# Patient Record
Sex: Female | Born: 1950 | Race: Black or African American | Hispanic: No | Smoking: Never smoker
Health system: Southern US, Community
[De-identification: ages and names within clinical notes are randomized; demographics above are authoritative.]

## PROBLEM LIST (undated history)

## (undated) DIAGNOSIS — C50919 Malignant neoplasm of unspecified site of unspecified female breast: Secondary | ICD-10-CM

## (undated) DIAGNOSIS — Z923 Personal history of irradiation: Secondary | ICD-10-CM

## (undated) DIAGNOSIS — C801 Malignant (primary) neoplasm, unspecified: Secondary | ICD-10-CM

## (undated) HISTORY — PX: BREAST LUMPECTOMY: SHX2

## (undated) HISTORY — PX: BREAST BIOPSY: SHX20

## (undated) HISTORY — PX: BREAST EXCISIONAL BIOPSY: SUR124

---

## 1997-09-29 ENCOUNTER — Ambulatory Visit (HOSPITAL_COMMUNITY): Admission: RE | Admit: 1997-09-29 | Discharge: 1997-09-29 | Payer: Self-pay | Admitting: Surgery

## 1997-10-15 ENCOUNTER — Encounter: Admission: RE | Admit: 1997-10-15 | Discharge: 1998-01-13 | Payer: Self-pay | Admitting: Radiation Oncology

## 1997-11-06 ENCOUNTER — Ambulatory Visit (HOSPITAL_COMMUNITY): Admission: RE | Admit: 1997-11-06 | Discharge: 1997-11-06 | Payer: Self-pay | Admitting: Surgery

## 1998-01-14 ENCOUNTER — Encounter: Admission: RE | Admit: 1998-01-14 | Discharge: 1998-04-14 | Payer: Self-pay | Admitting: Radiation Oncology

## 1999-04-05 ENCOUNTER — Encounter: Admission: RE | Admit: 1999-04-05 | Discharge: 1999-04-05 | Payer: Self-pay | Admitting: Obstetrics and Gynecology

## 1999-04-05 ENCOUNTER — Encounter: Payer: Self-pay | Admitting: Obstetrics and Gynecology

## 1999-10-13 ENCOUNTER — Encounter: Payer: Self-pay | Admitting: Surgery

## 1999-10-13 ENCOUNTER — Encounter: Admission: RE | Admit: 1999-10-13 | Discharge: 1999-10-13 | Payer: Self-pay | Admitting: Surgery

## 2000-10-17 ENCOUNTER — Encounter: Admission: RE | Admit: 2000-10-17 | Discharge: 2000-10-17 | Payer: Self-pay | Admitting: Surgery

## 2000-10-17 ENCOUNTER — Encounter: Payer: Self-pay | Admitting: Surgery

## 2001-10-19 ENCOUNTER — Encounter: Payer: Self-pay | Admitting: Surgery

## 2001-10-19 ENCOUNTER — Encounter: Admission: RE | Admit: 2001-10-19 | Discharge: 2001-10-19 | Payer: Self-pay | Admitting: Surgery

## 2002-11-13 ENCOUNTER — Encounter: Payer: Self-pay | Admitting: Surgery

## 2002-11-13 ENCOUNTER — Encounter: Admission: RE | Admit: 2002-11-13 | Discharge: 2002-11-13 | Payer: Self-pay | Admitting: Surgery

## 2003-07-31 ENCOUNTER — Emergency Department (HOSPITAL_COMMUNITY): Admission: EM | Admit: 2003-07-31 | Discharge: 2003-07-31 | Payer: Self-pay | Admitting: Family Medicine

## 2003-12-17 ENCOUNTER — Encounter: Admission: RE | Admit: 2003-12-17 | Discharge: 2003-12-17 | Payer: Self-pay | Admitting: Surgery

## 2004-12-29 ENCOUNTER — Encounter: Admission: RE | Admit: 2004-12-29 | Discharge: 2004-12-29 | Payer: Self-pay | Admitting: Obstetrics and Gynecology

## 2006-01-03 ENCOUNTER — Encounter: Admission: RE | Admit: 2006-01-03 | Discharge: 2006-01-03 | Payer: Self-pay | Admitting: Obstetrics and Gynecology

## 2007-01-09 ENCOUNTER — Encounter: Admission: RE | Admit: 2007-01-09 | Discharge: 2007-01-09 | Payer: Self-pay | Admitting: Obstetrics and Gynecology

## 2009-05-05 ENCOUNTER — Encounter: Admission: RE | Admit: 2009-05-05 | Discharge: 2009-05-05 | Payer: Self-pay | Admitting: Obstetrics and Gynecology

## 2009-05-12 ENCOUNTER — Encounter: Admission: RE | Admit: 2009-05-12 | Discharge: 2009-05-12 | Payer: Self-pay | Admitting: Obstetrics and Gynecology

## 2009-12-14 ENCOUNTER — Emergency Department (HOSPITAL_COMMUNITY): Admission: EM | Admit: 2009-12-14 | Discharge: 2009-12-14 | Payer: Self-pay | Admitting: Family Medicine

## 2010-05-06 ENCOUNTER — Encounter
Admission: RE | Admit: 2010-05-06 | Discharge: 2010-05-06 | Payer: Self-pay | Source: Home / Self Care | Attending: Obstetrics and Gynecology | Admitting: Obstetrics and Gynecology

## 2010-10-02 IMAGING — US US BREAST R
1 series · 9 of 9 positions shown · non-contrast
Comparison: Prior studies

CLINICAL DATA: Abnormal screening mammogram.

DIGITAL DIAGNOSTIC RIGHT BREAST MAMMOGRAM  AND RIGHT BREAST
ULTRASOUND:

[Series 1: us breast right · 9 of 9 slices shown]
[im 1/9]
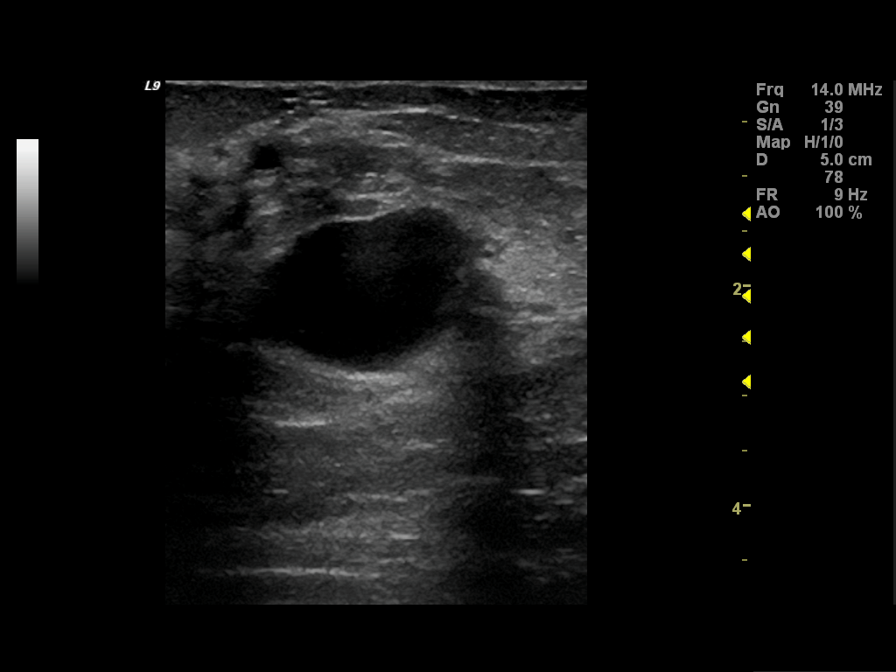
[im 2/9]
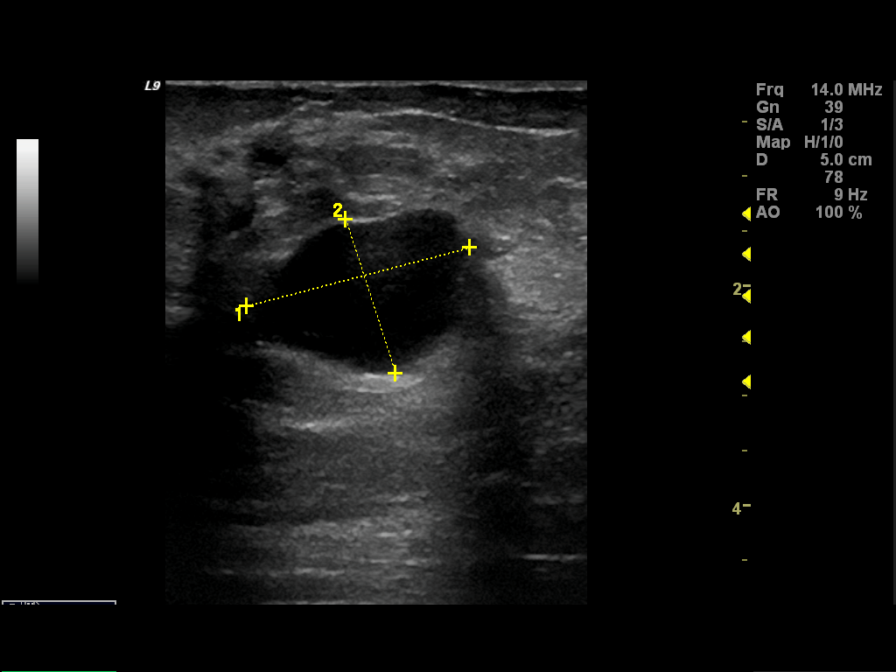
[im 3/9]
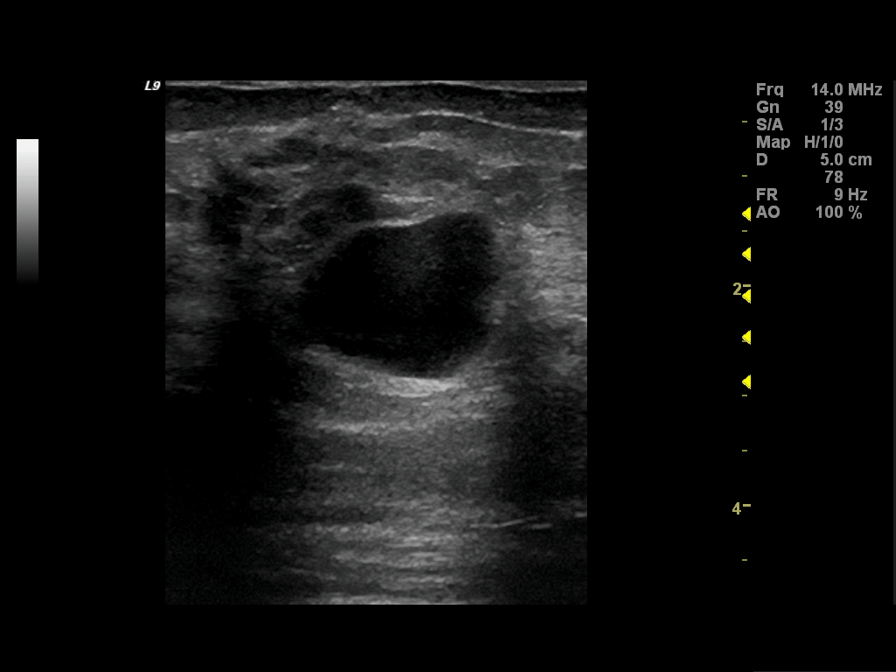
[im 4/9]
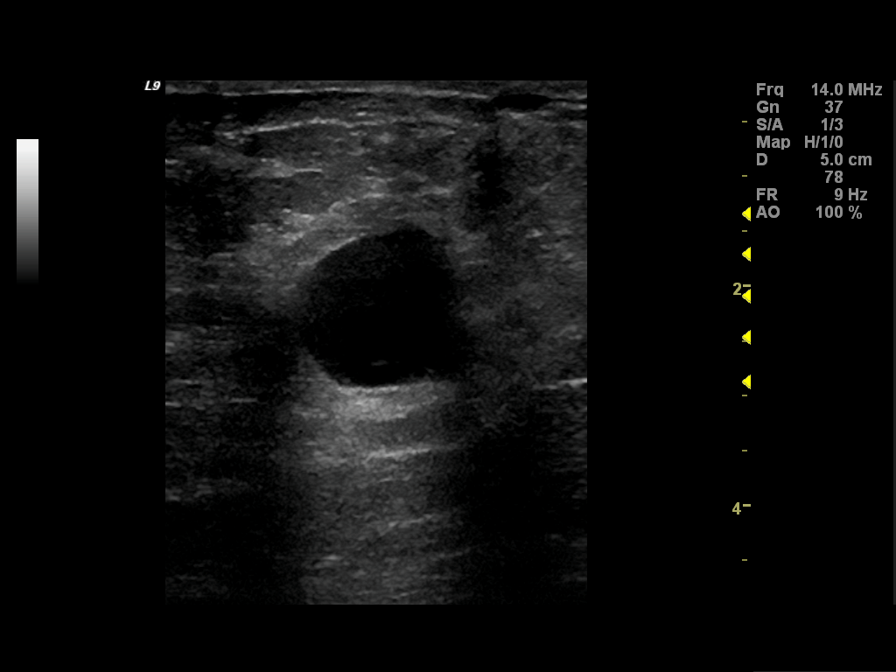
[im 5/9]
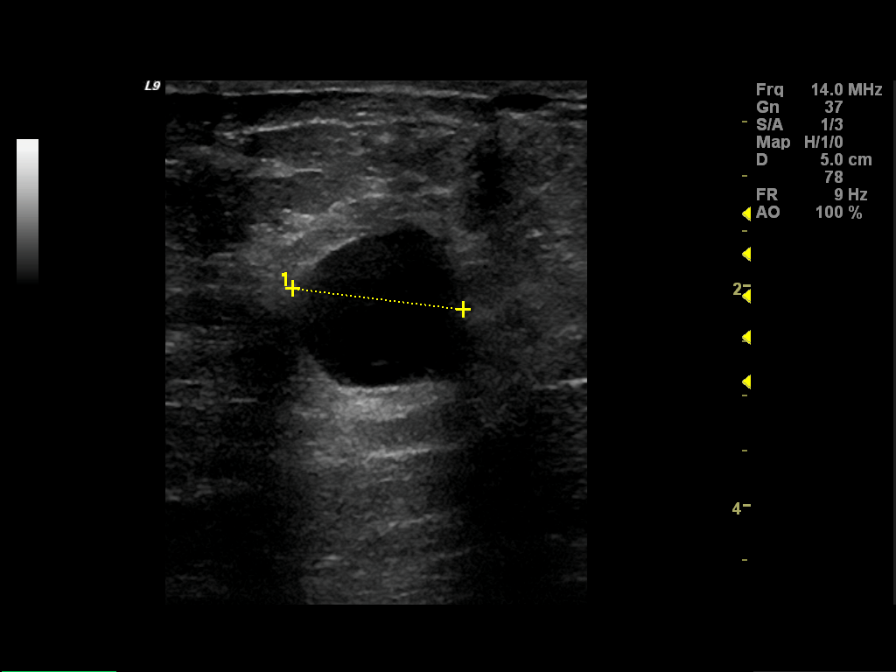
[im 6/9]
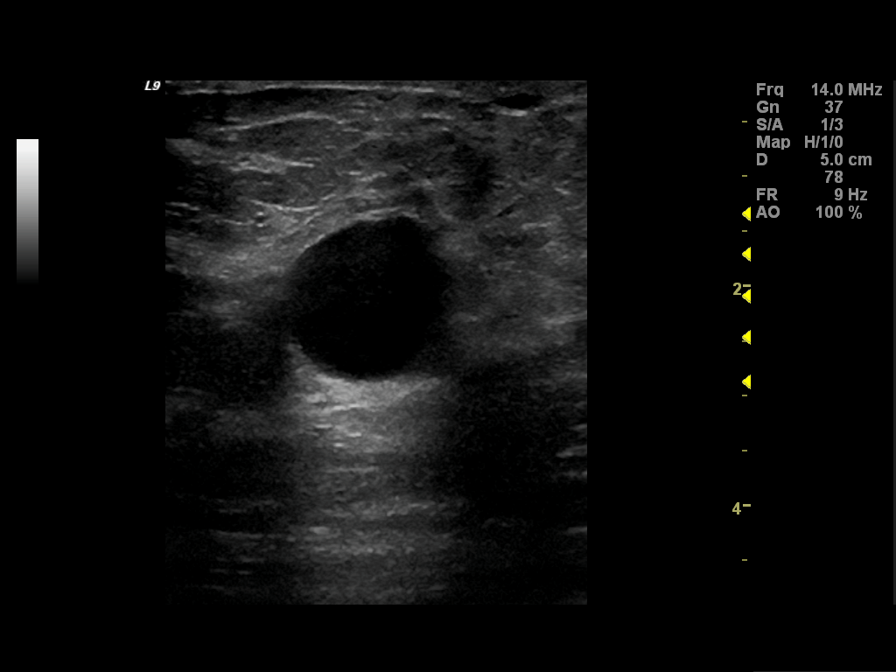
[im 7/9]
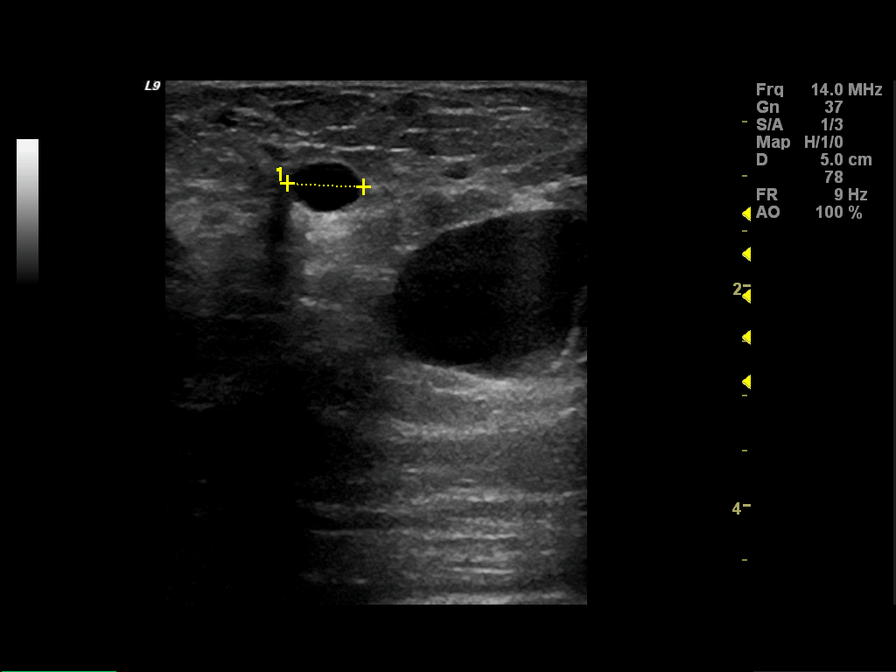
[im 8/9]
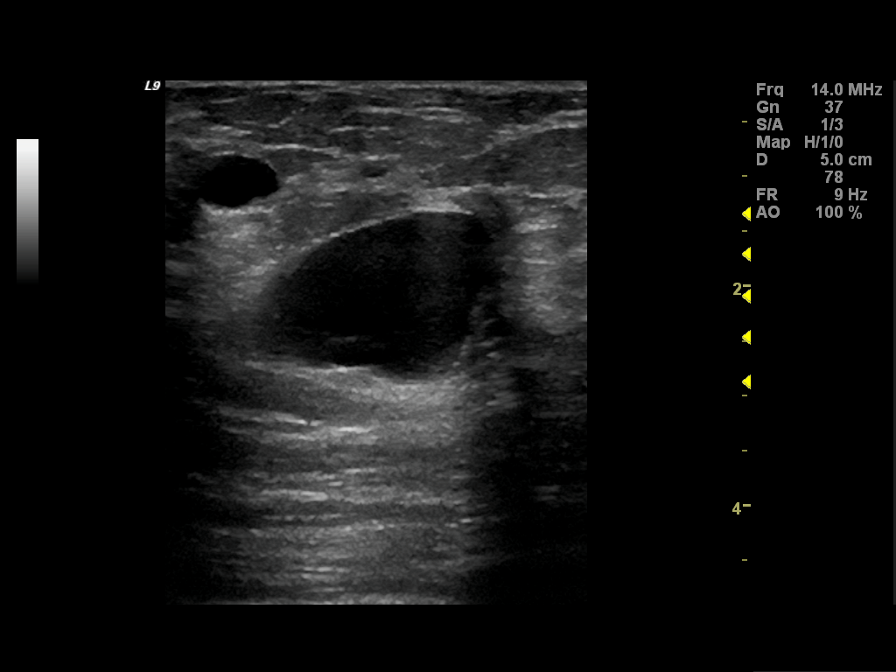
[im 9/9]
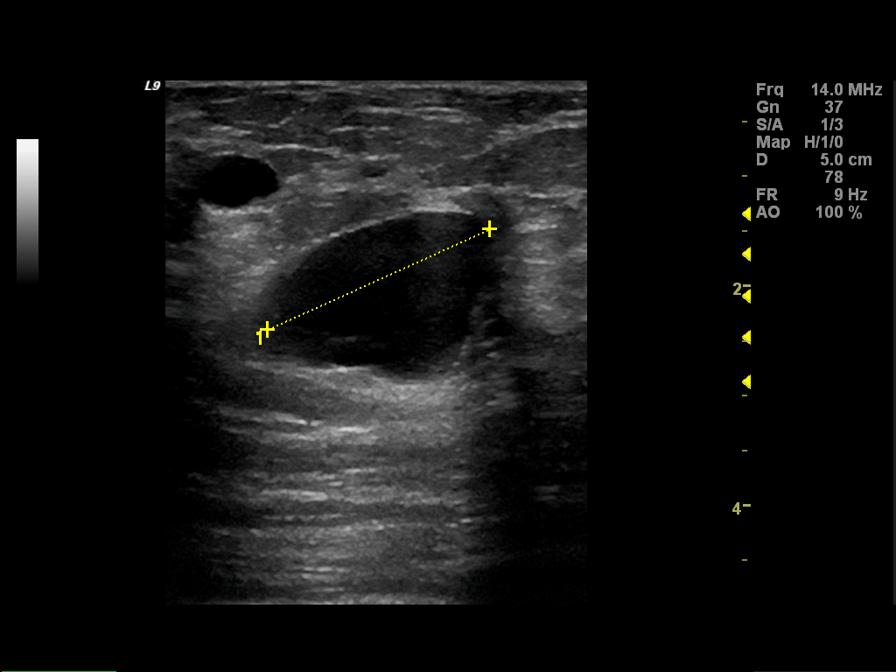

[9 of 9 positions shown; findings below may reference images not displayed]

FINDINGS: Spot compression views of the right breast demonstrate
an oval circumscribed nodule to be present within the right breast
at the 12 o'clock position approximately 2 cm from the nipple.

On physical exam, there is no discrete palpable abnormality.

Ultrasound is performed, showing a simple cyst located within the
right breast at the 12 o'clock position 2 cm from the nipple.  This
corresponds to the nodule seen in this region on mammography.  This
measures 2.2 cm in greatest dimension.  There is an adjacent 7 mm
in size simple cyst also present.
IMPRESSION: The nodule noted on the recent screening mammogram within the right
breast corresponds to a 2.2 cm in size simple cyst.  There is a
smaller 7 mm in size adjacent simple cyst present, as well.  No
findings worrisome for malignancy.  Recommend screening mammography
in 1 year.

BI-RADS CATEGORY 2:  Benign finding(s).

## 2011-03-14 ENCOUNTER — Other Ambulatory Visit: Payer: Self-pay | Admitting: Obstetrics and Gynecology

## 2011-03-14 DIAGNOSIS — Z1231 Encounter for screening mammogram for malignant neoplasm of breast: Secondary | ICD-10-CM

## 2011-05-09 ENCOUNTER — Ambulatory Visit
Admission: RE | Admit: 2011-05-09 | Discharge: 2011-05-09 | Disposition: A | Discharge status: Home / Self Care | Payer: 59 | Source: Ambulatory Visit | Attending: Obstetrics and Gynecology | Admitting: Obstetrics and Gynecology

## 2011-05-09 DIAGNOSIS — Z1231 Encounter for screening mammogram for malignant neoplasm of breast: Secondary | ICD-10-CM

## 2012-05-14 ENCOUNTER — Other Ambulatory Visit: Payer: Self-pay | Admitting: Obstetrics and Gynecology

## 2012-05-14 DIAGNOSIS — Z1231 Encounter for screening mammogram for malignant neoplasm of breast: Secondary | ICD-10-CM

## 2012-06-13 ENCOUNTER — Ambulatory Visit: Payer: 59

## 2016-09-13 ENCOUNTER — Other Ambulatory Visit: Payer: Self-pay | Admitting: Family Medicine

## 2016-09-13 DIAGNOSIS — Z1231 Encounter for screening mammogram for malignant neoplasm of breast: Secondary | ICD-10-CM

## 2016-10-05 ENCOUNTER — Ambulatory Visit
Admission: RE | Admit: 2016-10-05 | Discharge: 2016-10-05 | Disposition: A | Discharge status: Home / Self Care | Payer: Medicare Other | Source: Ambulatory Visit | Attending: Family Medicine | Admitting: Family Medicine

## 2016-10-05 DIAGNOSIS — Z1231 Encounter for screening mammogram for malignant neoplasm of breast: Secondary | ICD-10-CM

## 2016-10-05 HISTORY — DX: Malignant (primary) neoplasm, unspecified: C80.1

## 2016-10-05 HISTORY — DX: Malignant neoplasm of unspecified site of unspecified female breast: C50.919

## 2016-10-05 HISTORY — DX: Personal history of irradiation: Z92.3

## 2017-11-16 ENCOUNTER — Other Ambulatory Visit: Payer: Self-pay | Admitting: Family Medicine

## 2017-11-16 DIAGNOSIS — Z1231 Encounter for screening mammogram for malignant neoplasm of breast: Secondary | ICD-10-CM

## 2017-11-29 ENCOUNTER — Ambulatory Visit
Admission: RE | Admit: 2017-11-29 | Discharge: 2017-11-29 | Disposition: A | Discharge status: Home / Self Care | Payer: Medicare Other | Source: Ambulatory Visit | Attending: Family Medicine | Admitting: Family Medicine

## 2017-11-29 DIAGNOSIS — Z1231 Encounter for screening mammogram for malignant neoplasm of breast: Secondary | ICD-10-CM

## 2019-01-30 ENCOUNTER — Ambulatory Visit (INDEPENDENT_AMBULATORY_CARE_PROVIDER_SITE_OTHER): Payer: Medicare Other

## 2019-01-30 ENCOUNTER — Ambulatory Visit (HOSPITAL_COMMUNITY)
Admission: EM | Admit: 2019-01-30 | Discharge: 2019-01-30 | Disposition: A | Discharge status: Home / Self Care | Payer: Medicare Other | Attending: Emergency Medicine | Admitting: Emergency Medicine

## 2019-01-30 ENCOUNTER — Encounter (HOSPITAL_COMMUNITY): Payer: Self-pay

## 2019-01-30 ENCOUNTER — Other Ambulatory Visit: Payer: Self-pay

## 2019-01-30 DIAGNOSIS — W010XXA Fall on same level from slipping, tripping and stumbling without subsequent striking against object, initial encounter: Secondary | ICD-10-CM | POA: Diagnosis not present

## 2019-01-30 DIAGNOSIS — S62619A Displaced fracture of proximal phalanx of unspecified finger, initial encounter for closed fracture: Secondary | ICD-10-CM

## 2019-01-30 MED ORDER — HYDROCODONE-ACETAMINOPHEN 5-325 MG PO TABS: 1.0000 | ORAL_TABLET | Freq: Four times a day (QID) | ORAL | 0 refills | Status: AC | PRN

## 2019-01-30 NOTE — Discharge Instructions (Signed)
Please follow up with Dr. Caralyn Guile at [facility]  Use anti-inflammatories for pain/swelling. You may take up to 600 mg Ibuprofen every 8 hours with food. You may supplement Ibuprofen with Tylenol [phone] mg every 8 hours.   Hydrocodone for severe pain, do not drive/work after taking

## 2019-01-30 NOTE — Progress Notes (Signed)
Orthopedic Tech Progress Note Patient Details:  Lori Burns 10/05/1950 [id number]  Ortho Devices Type of Ortho Device: Ulna gutter splint Ortho Device/Splint Location: ULE Ortho Device/Splint Interventions: Adjustment, Application, Ordered   Post Interventions Patient Tolerated: Well Instructions Provided: Care of device, Adjustment of device   Janit Pagan 01/30/2019, 11:17 AM

## 2019-01-30 NOTE — ED Notes (Signed)
Ortho tech paged  

## 2019-01-30 NOTE — ED Provider Notes (Signed)
[facility]    CSN: [id number] Arrival date & time: 01/30/19  0908      History   Chief Complaint Chief Complaint  Patient presents with   Fall   Hand Pain    HPI Lori Burns is a 68 y.o. female history of breast cancer, presenting today for evaluation of left hand/finger injury.  Yesterday she was walking out of her house, had her hand on the storm door tripped and fell while her finger was caught on the door.  Afterward she states that her left ring finger was in an "L" shape, she rushed inside grabbed her mascara and taped her finger to this in order to straighten it back out.  She has had pain swelling and bruising develop in her left ring finger as well as hand since.  Denies previous history of similar previous injury to this hand.  Denies numbness or tingling.  She has been icing and using Tylenol.  HPI  Past Medical History:  Diagnosis Date   Breast cancer ([place])    Cancer ([place])    Personal history of radiation therapy     There are no active problems to display for this patient.   Past Surgical History:  Procedure Laterality Date   BREAST BIOPSY     BREAST EXCISIONAL BIOPSY     BREAST LUMPECTOMY     left breast 1999    OB History   No obstetric history on file.      Home Medications    Prior to Admission medications   Medication Sig Start Date End Date Taking? Authorizing Provider  HYDROcodone-acetaminophen (NORCO/VICODIN) 5-325 MG tablet Take 1-2 tablets by mouth every 6 (six) hours as needed. 01/30/19   Leemon Ayala, Elesa Hacker, PA-C    Family History History reviewed. No pertinent family history.  Social History Social History   Tobacco Use   Smoking status: Never Smoker   Smokeless tobacco: Never Used  Substance Use Topics   Alcohol use: Never    Frequency: Never   Drug use: Never     Allergies   Patient has no known allergies.   Review of Systems Review of Systems  Constitutional: Negative for fatigue and  fever.  Eyes: Negative for visual disturbance.  Respiratory: Negative for shortness of breath.   Cardiovascular: Negative for chest pain.  Gastrointestinal: Negative for abdominal pain, nausea and vomiting.  Musculoskeletal: Positive for arthralgias and joint swelling.  Skin: Positive for color change. Negative for rash and wound.  Neurological: Negative for dizziness, weakness, light-headedness and headaches.     Physical Exam Triage Vital Signs ED Triage Vitals  Enc Vitals Group     BP 01/30/19 0927 (!) 187/97     Pulse Rate 01/30/19 0927 77     Resp 01/30/19 0927 16     Temp 01/30/19 0927 98.1 F (36.7 C)     Temp Source 01/30/19 0927 Temporal     SpO2 01/30/19 0927 98 %     Weight 01/30/19 0932 216 lb (98 kg)     Height --      Head Circumference --      Peak Flow --      Pain Score 01/30/19 0932 7     Pain Loc --      Pain Edu? --      Excl. in [place]? --    No data found.  Updated Vital Signs BP (!) 187/97 (BP Location: Left Arm)    Pulse 77    Temp  98.1 F (36.7 C) (Temporal)    Resp 16    Wt 216 lb (98 kg)    SpO2 98%   Visual Acuity Right Eye Distance:   Left Eye Distance:   Bilateral Distance:    Right Eye Near:   Left Eye Near:    Bilateral Near:     Physical Exam   UC Treatments / Results  Labs (all labs ordered are listed, but only abnormal results are displayed) Labs Reviewed - No data to display  EKG   Radiology Dg Hand Complete Left  Result Date: 01/30/2019 CLINICAL DATA:  Ring finger pain EXAM: LEFT HAND - COMPLETE 3+ VIEW COMPARISON:  None. FINDINGS: There is acute displaced comminuted fracture through the proximal phalanx of the fourth digit. There is surrounding soft tissue swelling. There are advanced degenerative changes of the first carpometacarpal joint. IMPRESSION: Acute displaced comminuted fracture of the proximal phalanx of the fourth digit. Electronically Signed   By: Constance Holster M.D.   On: 01/30/2019 10:12     Procedures Procedures (including critical care time)  Medications Ordered in UC Medications - No data to display  Initial Impression / Assessment and Plan / UC Course  I have reviewed the triage vital signs and the nursing notes.  Pertinent labs & imaging results that were available during my care of the patient were reviewed by me and considered in my medical decision making (see chart for details).      Acute comminuted displaced fracture of proximal left phalanx of left ring finger.  Placed in ulnar gutter and have follow-up with hand in the next 1 to 2 days.  Discussed with Orion Crook.  Provided hydrocodone for severe pain, recommended Tylenol for mild to moderate pain.  Do not drive or work after taking hydrocodone.Discussed strict return precautions. Patient verbalized understanding and is agreeable with plan.  Final Clinical Impressions(s) / UC Diagnoses   Final diagnoses:  Displaced fracture of proximal phalanx of finger of left hand     Discharge Instructions     Please follow up with Dr. Caralyn Guile at [facility]  Use anti-inflammatories for pain/swelling. You may take up to 600 mg Ibuprofen every 8 hours with food. You may supplement Ibuprofen with Tylenol [phone] every 8 hours.   Hydrocodone for severe pain, do not drive/work after taking     ED Prescriptions    Medication Sig Dispense Auth. Provider   HYDROcodone-acetaminophen (NORCO/VICODIN) 5-325 MG tablet Take 1-2 tablets by mouth every 6 (six) hours as needed. 10 tablet Leon Goodnow, [place] C, PA-C     I have reviewed the PDMP during this encounter.   Janith Lima, [place] 01/30/19 1113

## 2019-01-30 NOTE — ED Triage Notes (Signed)
Pt states she fell over something and hurt her left hand ( ring finger ). Pt states this happened yesterday.

## 2019-07-08 ENCOUNTER — Ambulatory Visit: Payer: Medicare Other | Attending: Internal Medicine

## 2019-07-08 DIAGNOSIS — Z23 Encounter for immunization: Secondary | ICD-10-CM | POA: Insufficient documentation

## 2019-07-08 NOTE — Progress Notes (Signed)
   Covid-19 Vaccination Clinic  Name:  Lori Burns    MRN: [id number] DOB: Feb 04, 1951  07/08/2019  Lori Burns was observed post Covid-19 immunization for 15 minutes without incidence. She was provided with Vaccine Information Sheet and instruction to access the V-Safe system.   Lori Burns was instructed to call 911 with any severe reactions post vaccine: Marland Kitchen Difficulty breathing  . Swelling of your face and throat  . A fast heartbeat  . A bad rash all over your body  . Dizziness and weakness    Immunizations Administered    Name Date Dose VIS Date Route   Pfizer COVID-19 Vaccine 07/08/2019 11:15 AM 0.3 mL 04/19/2019 Intramuscular   Manufacturer: [place]   Lot: [id number]   [place]: [id number]

## 2019-08-06 ENCOUNTER — Ambulatory Visit: Payer: Medicare Other | Attending: Internal Medicine

## 2019-08-06 DIAGNOSIS — Z23 Encounter for immunization: Secondary | ICD-10-CM

## 2019-08-06 NOTE — Progress Notes (Signed)
   Covid-19 Vaccination Clinic  Name:  Lori Burns    MRN: [id number] DOB: March 25, 1951  08/06/2019  [name] was observed post Covid-19 immunization for 15 minutes without incident. She was provided with Vaccine Information Sheet and instruction to access the V-Safe system.   [name] was instructed to call 911 with any severe reactions post vaccine: Marland Kitchen Difficulty breathing  . Swelling of face and throat  . A fast heartbeat  . A bad rash all over body  . Dizziness and weakness   Immunizations Administered    Name Date Dose VIS Date Route   Pfizer COVID-19 Vaccine 08/06/2019 11:51 AM 0.3 mL 04/19/2019 Intramuscular   Manufacturer: [place]   Lot: [id number]   [place]: [id number]

## 2020-01-08 ENCOUNTER — Other Ambulatory Visit: Payer: Self-pay | Admitting: Family Medicine

## 2020-01-08 DIAGNOSIS — Z1231 Encounter for screening mammogram for malignant neoplasm of breast: Secondary | ICD-10-CM

## 2020-01-31 ENCOUNTER — Ambulatory Visit
Admission: RE | Admit: 2020-01-31 | Discharge: 2020-01-31 | Disposition: A | Discharge status: Home / Self Care | Payer: Medicare Other | Source: Ambulatory Visit | Attending: Family Medicine | Admitting: Family Medicine

## 2020-01-31 ENCOUNTER — Other Ambulatory Visit: Payer: Self-pay

## 2020-01-31 DIAGNOSIS — Z1231 Encounter for screening mammogram for malignant neoplasm of breast: Secondary | ICD-10-CM

## 2020-02-04 ENCOUNTER — Other Ambulatory Visit: Payer: Self-pay | Admitting: Family Medicine

## 2020-02-04 DIAGNOSIS — R928 Other abnormal and inconclusive findings on diagnostic imaging of breast: Secondary | ICD-10-CM

## 2020-02-14 ENCOUNTER — Ambulatory Visit
Admission: RE | Admit: 2020-02-14 | Discharge: 2020-02-14 | Disposition: A | Discharge status: Home / Self Care | Payer: Medicare Other | Source: Ambulatory Visit | Attending: Family Medicine | Admitting: Family Medicine

## 2020-02-14 ENCOUNTER — Other Ambulatory Visit: Payer: Self-pay

## 2020-02-14 DIAGNOSIS — R928 Other abnormal and inconclusive findings on diagnostic imaging of breast: Secondary | ICD-10-CM

## 2020-07-09 DIAGNOSIS — I10 Essential (primary) hypertension: Secondary | ICD-10-CM | POA: Diagnosis not present

## 2020-07-09 DIAGNOSIS — H6502 Acute serous otitis media, left ear: Secondary | ICD-10-CM | POA: Diagnosis not present

## 2020-07-09 DIAGNOSIS — Z0000 Encounter for general adult medical examination without abnormal findings: Secondary | ICD-10-CM | POA: Diagnosis not present

## 2020-07-09 DIAGNOSIS — M8588 Other specified disorders of bone density and structure, other site: Secondary | ICD-10-CM | POA: Diagnosis not present

## 2020-07-09 DIAGNOSIS — E785 Hyperlipidemia, unspecified: Secondary | ICD-10-CM | POA: Diagnosis not present

## 2020-07-09 DIAGNOSIS — M170 Bilateral primary osteoarthritis of knee: Secondary | ICD-10-CM | POA: Diagnosis not present

## 2020-07-13 ENCOUNTER — Other Ambulatory Visit: Payer: Self-pay | Admitting: Family Medicine

## 2020-07-13 DIAGNOSIS — M8580 Other specified disorders of bone density and structure, unspecified site: Secondary | ICD-10-CM

## 2020-10-07 ENCOUNTER — Telehealth: Payer: Self-pay

## 2020-10-07 NOTE — Telephone Encounter (Signed)
I connected by phone with Lori Burns and/or patient's caregiver on 10/07/2020 at 1:14 PM to discuss the potential vaccination through our Homebound vaccination initiative.   Prevaccination Checklist for COVID-19 Vaccines  1.  Are you feeling sick today? no  2.  Have you ever received a dose of a COVID-19 vaccine?  yes      If yes, which one? Pfizer   How many dose of Covid-19 vaccine have your received and dates ? 3, 07/08/2019, 08/06/2019, 04/11/2020   Check all that apply: I live in a long-term care setting. no  I have been diagnosed with a medical condition(s). Please list: Caregiver for a homebound patient (pertinent to homebound status)  I am a first responder. no  I work in a long-term care facility, correctional facility, hospital, restaurant, retail setting, school, or other setting with high exposure to the public. no  4. Do you have a health condition or are you undergoing treatment that makes you moderately or severely immunocompromised? (This would include treatment for cancer or HIV, receipt of organ transplant, immunosuppressive therapy or high-dose corticosteroids, CAR-T-cell therapy, hematopoietic cell transplant [HCT], DiGeorge syndrome or Wiskott-Aldrich syndrome)  no  5. Have you received hematopoietic cell transplant (HCT) or CAR-T-cell therapies since receiving COVID-19 vaccine? no  6.  Have you ever had an allergic reaction: (This would include a severe reaction [ e.g., anaphylaxis] that required treatment with epinephrine or EpiPen or that caused you to go to the hospital.  It would also include an allergic reaction that occurred within 4 hours that caused hives, swelling, or respiratory distress, including wheezing.) A.  A previous dose of COVID-19 vaccine. no  B.  A vaccine or injectable therapy that contains multiple components, one of which is a COVID-19 vaccine component, but it is not known which component elicited the immediate reaction. no  C.  Are you allergic  to polyethylene glycol? no  D. Are you allergic to Polysorbate, which is found in some vaccines, film coated tablets and intravenous steroids?  no   7.  Have you ever had an allergic reaction to another vaccine (other than COVID-19 vaccine) or an injectable medication? (This would include a severe reaction [ e.g., anaphylaxis] that required treatment with epinephrine or EpiPen or that caused you to go to the hospital.  It would also include an allergic reaction that occurred within 4 hours that caused hives, swelling, or respiratory distress, including wheezing.)  no   8.  Have you ever had a severe allergic reaction (e.g., anaphylaxis) to something other than a component of the COVID-19 vaccine, or any vaccine or injectable medication?  This would include food, pet, venom, environmental, or oral medication allergies.  no   Check all that apply to you:  Am a female between ages 56 and 64 years old  no  Women 34 through 71 years of age can receive any FDA-authorized or -approved COVID-19 vaccine. However, they should be informed of the rare but increased risk of thrombosis with thrombocytopenia syndrome (TTS) after receipt of the [employer] Vaccine and the availability of other FDA-authorized and -approved COVID-19 vaccines. People who had TTS after a first dose of Janssen vaccine should not receive a subsequent dose of Janssen product    Am a female between ages 66 and 28 years old  no Males 5 through 71 years of age may receive the correct formulation of Pfizer-BioNTech COVID-19 vaccine. Males 18 and older can receive any FDA-authorized or -approved vaccine. However, people receiving an  mRNA COVID-19 vaccine, especially males 24 through 71 years of age and their parents/legal representative (when relevant), should be informed of the risk of developing myocarditis (an inflammation of the heart muscle) or pericarditis (inflammation of the lining around the heart) after receipt of an mRNA vaccine. The  risk of developing either myocarditis or pericarditis after vaccination is low, and lower than the risk of myocarditis associated with SARS-CoV-2 infection in adolescents and adults. Vaccine recipients should be counseled about the need to seek care if symptoms of myocarditis or pericarditis develop after vaccination     Have a history of myocarditis or pericarditis  no Myocarditis or pericarditis after receipt of the first dose of an mRNA COVID-19 vaccine series but before administration of the second dose  Experts advise that people who develop myocarditis or pericarditis after a dose of an mRNA COVID-19 vaccine not receive a subsequent dose of any COVID-19 vaccine, until additional safety data are available.  Administration of a subsequent dose of COVID-19 vaccine before safety data are available can be considered in certain circumstances after the episode of myocarditis or pericarditis has completely resolved. Until additional data are available, some experts recommend a Alphonsa Overall COVID-19 vaccine be considered instead of an mRNA COVID-19 vaccine. Decisions about proceeding with a subsequent dose should include a conversation between the patient, their parent/legal representative (when relevant), and their clinical team, which may include a cardiologist.    Have been treated with monoclonal antibodies or convalescent serum to prevent or treat COVID-19  no Vaccination should be offered to people regardless of history of prior symptomatic or asymptomatic SARS-CoV-2 infection. There is no recommended minimal interval between infection and vaccination.  However, vaccination should be deferred if a patient received monoclonal antibodies or convalescent serum as treatment for COVID-19 or for post-exposure prophylaxis. This is a precautionary measure until additional information becomes available, to avoid interference of the antibody treatment with vaccine-induced immune responses.  Defer COVID-19 vaccination  for 30 days when a passive antibody product was used for post-exposure prophylaxis.  Defer COVID-19 vaccination for 90 days when a passive antibody product was used to treat COVID-19.     Diagnosed with Multisystem Inflammatory Syndrome (MIS-C or MIS-A) after a COVID-19 infection  no It is unknown if people with a history of MIS-C or MIS-A are at risk for a dysregulated immune response to COVID-19 vaccination.  People with a history of MIS-C or MIS-A may choose to be vaccinated. Considerations for vaccination may include:   Clinical recovery from MIS-C or MIS-A, including return to normal cardiac function   Personal risk of severe acute COVID-19 (e.g., age, underlying conditions)   High or substantial community transmission of SARS-CoV-2 and personal increased risk of reinfection.   Timing of any immunomodulatory therapies (general best practice guidelines for immunization can be consulted for more information [email or web address])   It has been 90 days or more since their diagnosis of MIS-C   Onset of MIS-C occurred before any COVID-19 vaccination   A conversation between the patient, their guardian(s), and their clinical team or a specialist may assist with COVID-19 vaccination decisions. Healthcare providers and health departments may also request a consultation from the [place] at [email or web address].     Have a bleeding disorder  no Take a blood thinner  no As with all vaccines, any COVID-19 vaccine product may be given to these patients, if a physician familiar with the patient's bleeding risk determines that the vaccine can be administered intramuscularly with  reasonable safety.  ACIP recommends the following technique for intramuscular vaccination in patients with bleeding disorders or taking blood thinners: a fine-gauge needle (23-gauge or smaller  caliber) should be used for the vaccination, followed by firm pressure on the site, without rubbing, for at least 2 minutes.  People who regularly take aspirin or anticoagulants as part of their routine medications do not need to stop these medications prior to receipt of any COVID-19 vaccine.    Have a history of heparin-induced thrombocytopenia (HIT)  no Although the etiology of TTS associated with the Alphonsa Overall COVID-19 vaccine is unclear, it appears to be similar to another rare immune-mediated syndrome, heparin-induced thrombocytopenia (HIT). People with a history of an episode of an immune-mediated syndrome characterized by thrombosis and thrombocytopenia, such as HIT, should be offered a currently FDA-approved or FDA-authorized mRNA COVID-19 vaccine if it has been ?90 days since their TTS resolved. After 90 days, patients may be vaccinated with any currently FDA-approved or FDA-authorized COVID-19 vaccine, including Janssen COVID-19 Vaccine. However, people who developed TTS after their initial Alphonsa Overall vaccine should not receive a Janssen booster dose.  Experts believe the following factors do not make people more susceptible to TTS after receipt of the [employer]. People with these conditions can be vaccinated with any FDA-authorized or - approved COVID-19 vaccine, including the [employer] COVID-19 Vaccine:   A prior history of venous thromboembolism   Risk factors for venous thromboembolism (e.g., inherited or acquired thrombophilia including Factor V Leiden; prothrombin gene 20210A mutation; antiphospholipid syndrome; protein C, protein S or antithrombin deficiency   A prior history of other types of thromboses not associated with thrombocytopenia   Pregnancy, post-partum status, or receipt of hormonal contraceptives (e.g., combined oral contraceptives, patch, ring)   Additional recipient education materials can be found at http://gutierrez-robinson.[email or web address]  vaccines/safety/JJUpdate.html.    Am currently pregnant or breastfeeding  no Vaccination is recommended for all people aged 55 years and older, including people that are:   Pregnant   Breastfeeding   Trying to get pregnant now or who might become pregnant in the future   Pregnant, breastfeeding, and post-partum people 42 through 71 years of age should be aware of the rare risk of TTS after receipt of the Alphonsa Overall COVID-19 Vaccine and the availability of other FDA-authorized or -approved COVID-19 vaccines (i.e., mRNA vaccines).    Have received dermal fillers  no FDA-authorized or -approved COVID-19 vaccines can be administered to people who have received injectable dermal fillers who have no contraindications for vaccination.  Infrequently, these people might experience temporary swelling at or near the site of filler injection (usually the face or lips) following administration of a dose of an mRNA COVID-19 vaccine. These people should be advised to contact their healthcare provider if swelling develops at or near the site of dermal filler following vaccination.     Have a history of Guillain-Barr Syndrome (GBS)  no People with a history of GBS can receive any FDA-authorized or -approved COVID-19 vaccine. However, given the possible association between the [employer] and an increased risk of GBS, a patient with a history of GBS and their clinical team should discuss the availability of mRNA vaccines to offer protection against COVID-19. The highest risk has been observed in men aged 66-64 years with symptoms of GBS beginning within 42 days after Alphonsa Overall COVID-19 vaccination.  People who had GBS after receiving Janssen vaccine should be made aware of the option to receive an mRNA COVID-19 vaccine booster at least 2 months (  8 weeks) after the Janssen dose. However, Alphonsa Overall vaccine may be used as a booster, particularly if GBS occurred more than 42 days after vaccination or was related  to a non-vaccine factor. Prior to booster vaccination, a conversation between the patient and their clinical team may assist with decisions about use of a COVID-19 booster dose, including the timing of administration     Postvaccination Observation Times for People without Contraindications to Covid 19 Vaccination.  30 minutes:  People with a history of: A contraindication to another type of COVID-19 vaccine product (i.e., mRNA or viral vector COVID-19 vaccines)   Immediate (within 4 hours of exposure) non-severe allergic reaction to a COVID-19 vaccine or injectable therapies   Anaphylaxis due to any cause   Immediate allergic reaction of any severity to a non-COVID-19 vaccine   15 minutes: All other people  This patient is a 70 y.o. female that meets the FDA criteria to receive homebound vaccination. Patient or parent/caregiver understands they have the option to accept or refuse homebound vaccination.  Patient passed the pre-screening checklist and would like to proceed with homebound vaccination.  Based on questionnaire above, I recommend the patient be observed for 15 minutes.  There are an estimated #1 other household members/caregivers who are also interested in receiving the vaccine.    The patient has been confirmed homebound and eligible for homebound vaccination with the considerations outlined above. I will send the patient's information to our scheduling team who will reach out to schedule the patient and potential caregiver/family members for homebound vaccination.    Dan Humphreys 10/07/2020 1:14 PM

## 2020-10-14 ENCOUNTER — Other Ambulatory Visit: Payer: Self-pay

## 2020-10-14 ENCOUNTER — Ambulatory Visit: Payer: Medicare Other | Attending: Critical Care Medicine

## 2020-10-14 DIAGNOSIS — Z23 Encounter for immunization: Secondary | ICD-10-CM

## 2020-11-13 DIAGNOSIS — Z961 Presence of intraocular lens: Secondary | ICD-10-CM | POA: Diagnosis not present

## 2020-11-13 DIAGNOSIS — H524 Presbyopia: Secondary | ICD-10-CM | POA: Diagnosis not present

## 2021-01-01 ENCOUNTER — Other Ambulatory Visit: Payer: Self-pay

## 2021-01-01 ENCOUNTER — Ambulatory Visit
Admission: RE | Admit: 2021-01-01 | Discharge: 2021-01-01 | Disposition: A | Discharge status: Home / Self Care | Payer: Medicare Other | Source: Ambulatory Visit | Attending: Family Medicine | Admitting: Family Medicine

## 2021-01-01 DIAGNOSIS — M85851 Other specified disorders of bone density and structure, right thigh: Secondary | ICD-10-CM | POA: Diagnosis not present

## 2021-01-01 DIAGNOSIS — Z780 Asymptomatic menopausal state: Secondary | ICD-10-CM | POA: Diagnosis not present

## 2021-01-01 DIAGNOSIS — M8580 Other specified disorders of bone density and structure, unspecified site: Secondary | ICD-10-CM

## 2021-01-14 DIAGNOSIS — E785 Hyperlipidemia, unspecified: Secondary | ICD-10-CM | POA: Diagnosis not present

## 2021-01-14 DIAGNOSIS — E559 Vitamin D deficiency, unspecified: Secondary | ICD-10-CM | POA: Diagnosis not present

## 2021-01-14 DIAGNOSIS — Z1159 Encounter for screening for other viral diseases: Secondary | ICD-10-CM | POA: Diagnosis not present

## 2021-01-14 DIAGNOSIS — I10 Essential (primary) hypertension: Secondary | ICD-10-CM | POA: Diagnosis not present

## 2021-01-29 ENCOUNTER — Other Ambulatory Visit: Payer: Self-pay | Admitting: Family Medicine

## 2021-01-29 DIAGNOSIS — Z1231 Encounter for screening mammogram for malignant neoplasm of breast: Secondary | ICD-10-CM

## 2021-02-05 ENCOUNTER — Other Ambulatory Visit: Payer: Self-pay

## 2021-02-05 ENCOUNTER — Ambulatory Visit
Admission: RE | Admit: 2021-02-05 | Discharge: 2021-02-05 | Disposition: A | Discharge status: Home / Self Care | Payer: Medicare Other | Source: Ambulatory Visit | Attending: Family Medicine | Admitting: Family Medicine

## 2021-02-05 DIAGNOSIS — Z1231 Encounter for screening mammogram for malignant neoplasm of breast: Secondary | ICD-10-CM | POA: Diagnosis not present

## 2021-04-21 DIAGNOSIS — Z23 Encounter for immunization: Secondary | ICD-10-CM | POA: Diagnosis not present

## 2021-07-19 DIAGNOSIS — M170 Bilateral primary osteoarthritis of knee: Secondary | ICD-10-CM | POA: Diagnosis not present

## 2021-07-19 DIAGNOSIS — Z0000 Encounter for general adult medical examination without abnormal findings: Secondary | ICD-10-CM | POA: Diagnosis not present

## 2021-07-19 DIAGNOSIS — I10 Essential (primary) hypertension: Secondary | ICD-10-CM | POA: Diagnosis not present

## 2021-07-19 DIAGNOSIS — E785 Hyperlipidemia, unspecified: Secondary | ICD-10-CM | POA: Diagnosis not present

## 2021-08-04 DIAGNOSIS — I10 Essential (primary) hypertension: Secondary | ICD-10-CM | POA: Diagnosis not present

## 2021-08-04 DIAGNOSIS — L723 Sebaceous cyst: Secondary | ICD-10-CM | POA: Diagnosis not present

## 2022-01-25 DIAGNOSIS — E785 Hyperlipidemia, unspecified: Secondary | ICD-10-CM | POA: Diagnosis not present

## 2022-01-25 DIAGNOSIS — I10 Essential (primary) hypertension: Secondary | ICD-10-CM | POA: Diagnosis not present

## 2022-01-25 DIAGNOSIS — E559 Vitamin D deficiency, unspecified: Secondary | ICD-10-CM | POA: Diagnosis not present

## 2022-01-27 ENCOUNTER — Other Ambulatory Visit: Payer: Self-pay | Admitting: Family Medicine

## 2022-01-27 DIAGNOSIS — Z1231 Encounter for screening mammogram for malignant neoplasm of breast: Secondary | ICD-10-CM

## 2022-02-17 DIAGNOSIS — D72819 Decreased white blood cell count, unspecified: Secondary | ICD-10-CM | POA: Diagnosis not present

## 2022-03-21 ENCOUNTER — Ambulatory Visit
Admission: RE | Admit: 2022-03-21 | Discharge: 2022-03-21 | Disposition: A | Discharge status: Home / Self Care | Payer: Medicare Other | Source: Ambulatory Visit | Attending: Family Medicine | Admitting: Family Medicine

## 2022-03-21 DIAGNOSIS — Z1231 Encounter for screening mammogram for malignant neoplasm of breast: Secondary | ICD-10-CM | POA: Diagnosis not present

## 2022-06-28 IMAGING — MG MM DIGITAL SCREENING BILAT W/ TOMO AND CAD
8 series · 8 of 24 positions shown · non-contrast
Comparison: Previous exam(s).

CLINICAL DATA: Screening.

EXAM:
DIGITAL SCREENING BILATERAL MAMMOGRAM WITH TOMOSYNTHESIS AND CAD
TECHNIQUE: Bilateral screening digital craniocaudal and mediolateral oblique
mammograms were obtained. Bilateral screening digital breast
tomosynthesis was performed. The images were evaluated with
computer-aided detection.

[L MLO synth-2D]
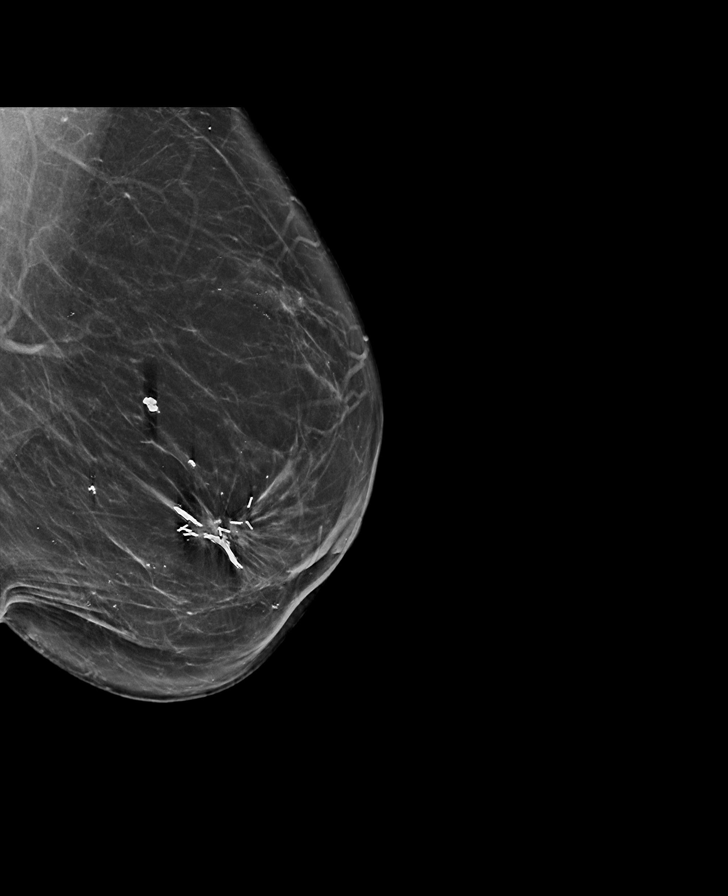

[L CC synth-2D]
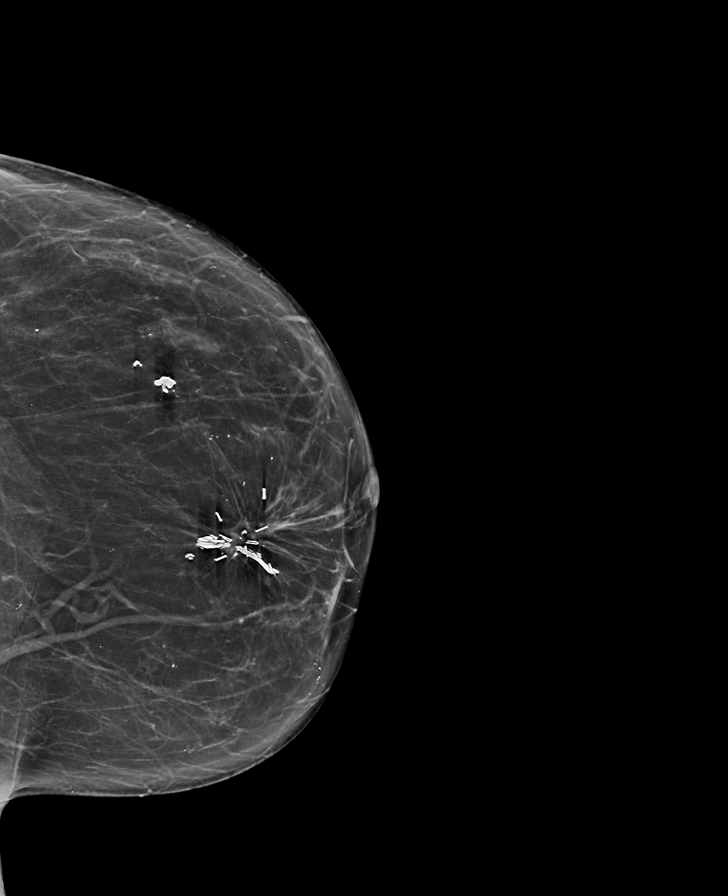

[R CC synth-2D]
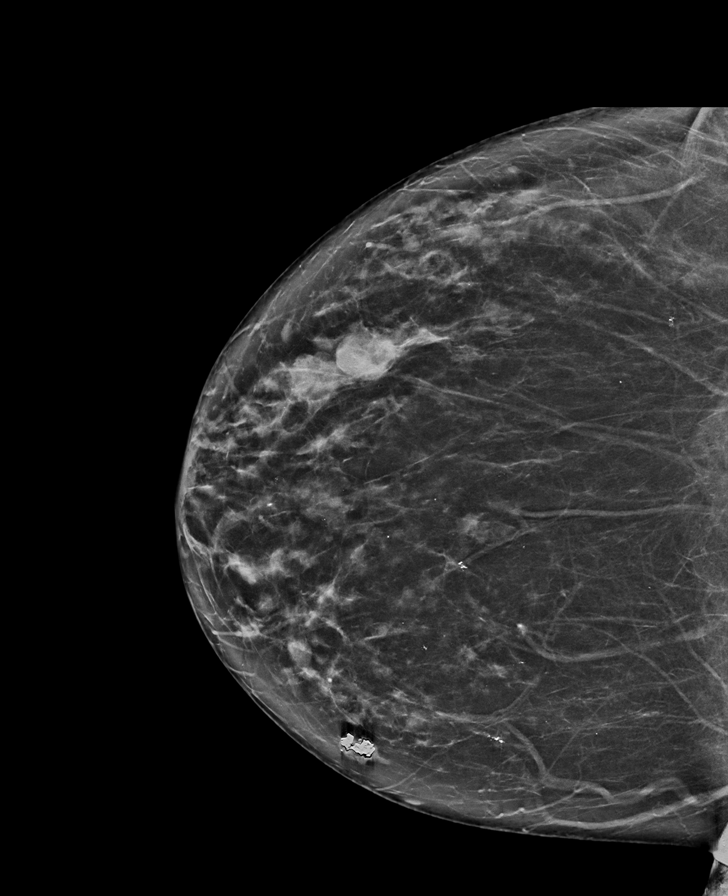

[R MLO synth-2D]
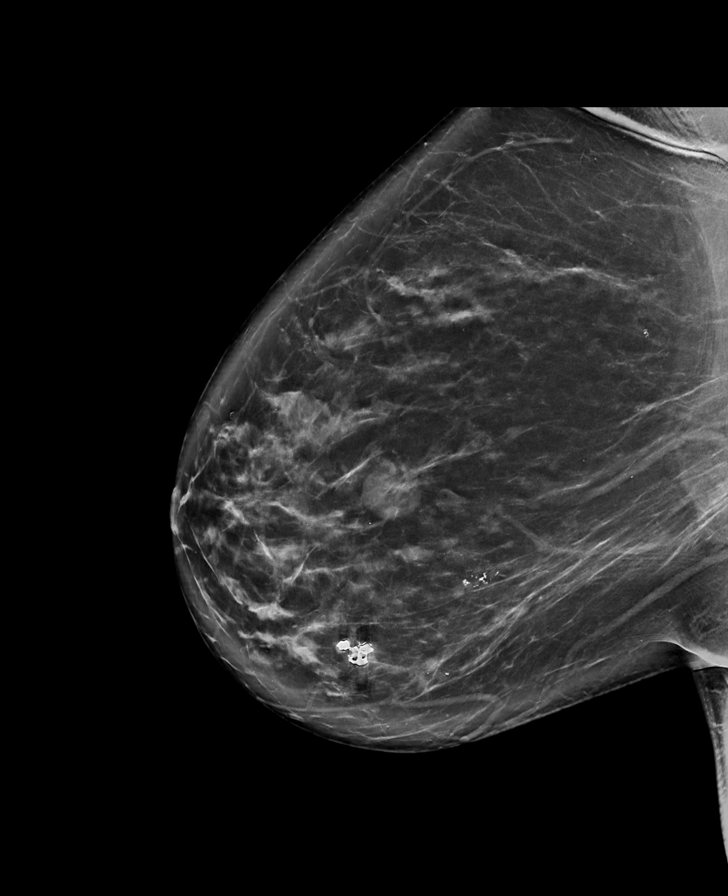

[L CC tomo · tomo slice 36/71.0]
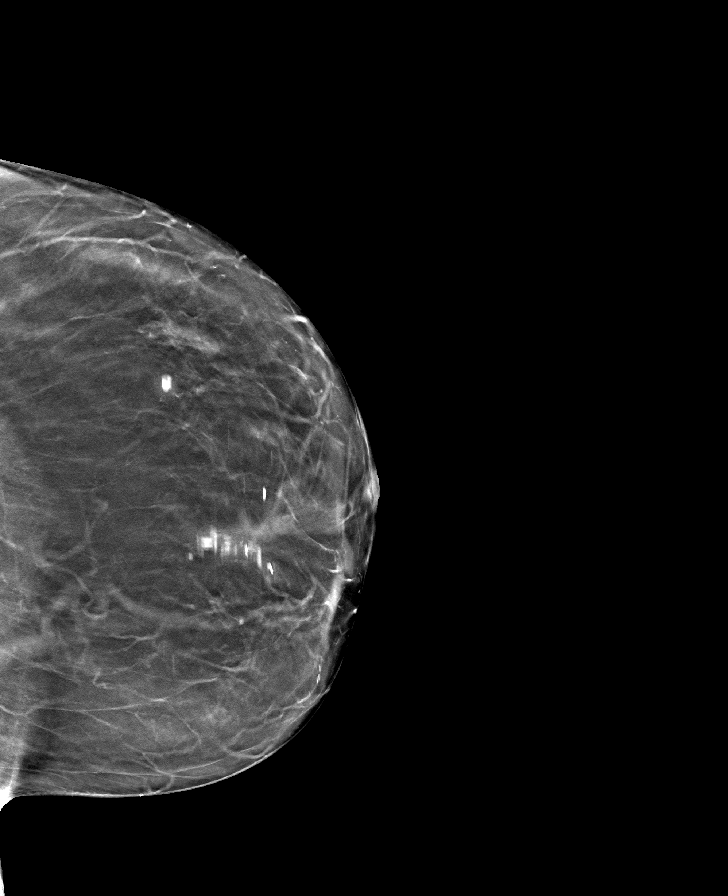

[R CC tomo · tomo slice 41/80.0]
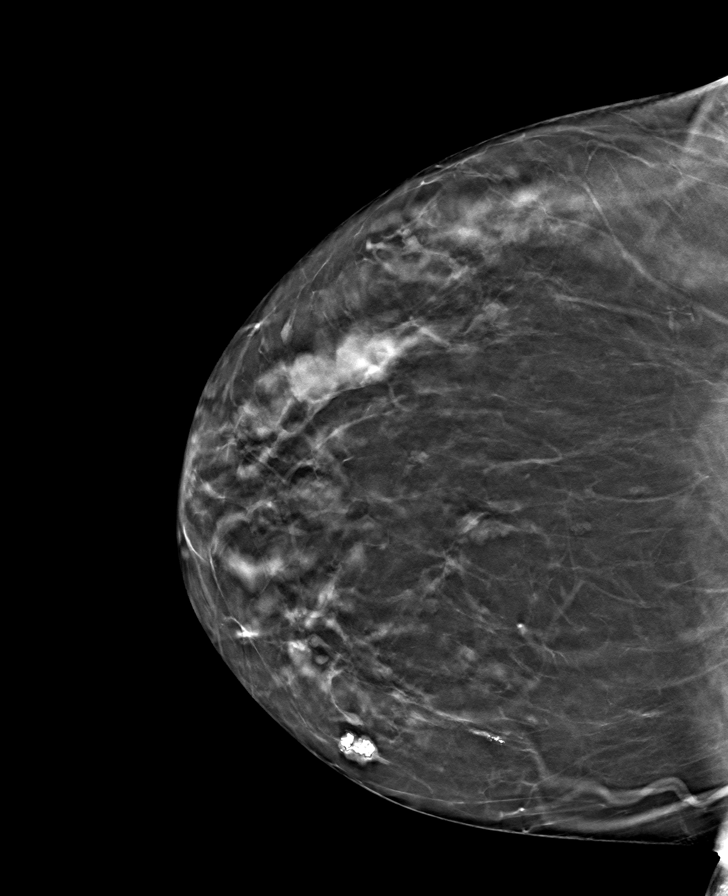

[R MLO tomo · tomo slice 49/97.0]
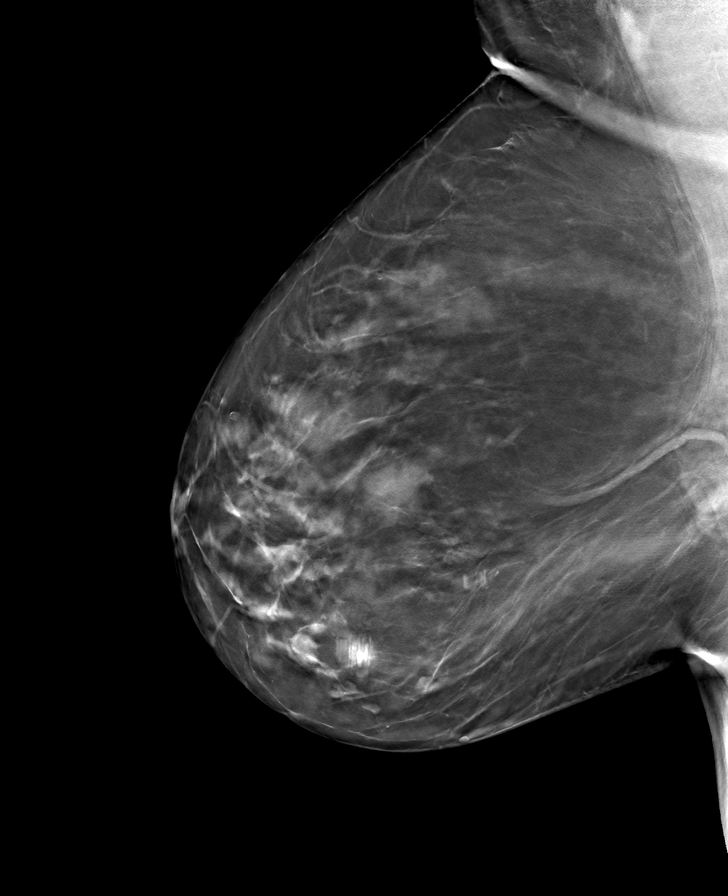

[L MLO tomo · tomo slice 41/80.0]
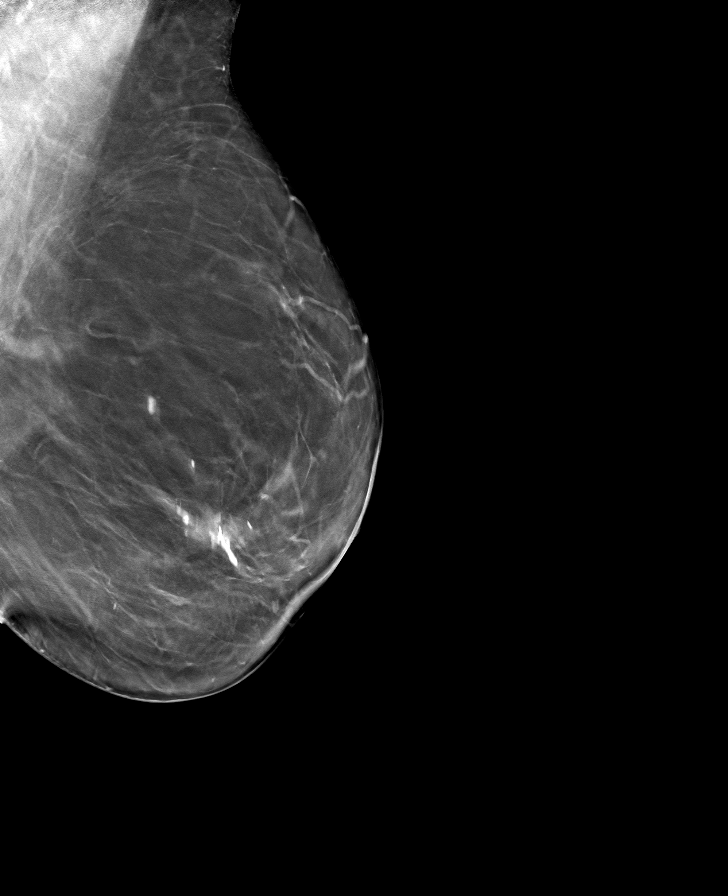

[8 of 24 positions shown; findings below may reference images not displayed]

ACR Breast Density Category b: There are scattered areas of
fibroglandular density.
FINDINGS: There are no findings suspicious for malignancy.
IMPRESSION: No mammographic evidence of malignancy. A result letter of this
screening mammogram will be mailed directly to the patient.

RECOMMENDATION:
Screening mammogram in one year. (Code:[id number]-[id number])

BI-RADS CATEGORY  1: Negative.

## 2022-08-08 DIAGNOSIS — M170 Bilateral primary osteoarthritis of knee: Secondary | ICD-10-CM | POA: Diagnosis not present

## 2022-08-08 DIAGNOSIS — Z0000 Encounter for general adult medical examination without abnormal findings: Secondary | ICD-10-CM | POA: Diagnosis not present

## 2022-08-08 DIAGNOSIS — H9312 Tinnitus, left ear: Secondary | ICD-10-CM | POA: Diagnosis not present

## 2022-08-08 DIAGNOSIS — M25511 Pain in right shoulder: Secondary | ICD-10-CM | POA: Diagnosis not present

## 2022-08-08 DIAGNOSIS — N3281 Overactive bladder: Secondary | ICD-10-CM | POA: Diagnosis not present

## 2022-08-08 DIAGNOSIS — E559 Vitamin D deficiency, unspecified: Secondary | ICD-10-CM | POA: Diagnosis not present

## 2022-08-08 DIAGNOSIS — I10 Essential (primary) hypertension: Secondary | ICD-10-CM | POA: Diagnosis not present

## 2022-08-08 DIAGNOSIS — E785 Hyperlipidemia, unspecified: Secondary | ICD-10-CM | POA: Diagnosis not present

## 2022-08-08 DIAGNOSIS — M8588 Other specified disorders of bone density and structure, other site: Secondary | ICD-10-CM | POA: Diagnosis not present

## 2022-10-13 DIAGNOSIS — I10 Essential (primary) hypertension: Secondary | ICD-10-CM | POA: Diagnosis not present

## 2022-10-13 DIAGNOSIS — M170 Bilateral primary osteoarthritis of knee: Secondary | ICD-10-CM | POA: Diagnosis not present

## 2022-10-13 DIAGNOSIS — H9312 Tinnitus, left ear: Secondary | ICD-10-CM | POA: Diagnosis not present

## 2023-03-27 DIAGNOSIS — E785 Hyperlipidemia, unspecified: Secondary | ICD-10-CM | POA: Diagnosis not present

## 2023-03-27 DIAGNOSIS — E559 Vitamin D deficiency, unspecified: Secondary | ICD-10-CM | POA: Diagnosis not present

## 2023-03-27 DIAGNOSIS — I10 Essential (primary) hypertension: Secondary | ICD-10-CM | POA: Diagnosis not present

## 2023-04-03 ENCOUNTER — Other Ambulatory Visit: Payer: Self-pay | Admitting: Family Medicine

## 2023-04-03 DIAGNOSIS — Z0000 Encounter for general adult medical examination without abnormal findings: Secondary | ICD-10-CM

## 2023-04-05 ENCOUNTER — Ambulatory Visit: Payer: Medicare Other

## 2023-05-05 ENCOUNTER — Ambulatory Visit: Payer: Medicare Other

## 2023-05-16 ENCOUNTER — Ambulatory Visit
Admission: RE | Admit: 2023-05-16 | Discharge: 2023-05-16 | Disposition: A | Discharge status: Home / Self Care | Payer: Medicare Other | Source: Ambulatory Visit | Attending: Family Medicine | Admitting: Family Medicine

## 2023-05-16 DIAGNOSIS — Z1231 Encounter for screening mammogram for malignant neoplasm of breast: Secondary | ICD-10-CM | POA: Diagnosis not present

## 2023-05-16 DIAGNOSIS — Z0000 Encounter for general adult medical examination without abnormal findings: Secondary | ICD-10-CM

## 2023-08-16 DIAGNOSIS — M25562 Pain in left knee: Secondary | ICD-10-CM | POA: Diagnosis not present

## 2023-08-16 DIAGNOSIS — M170 Bilateral primary osteoarthritis of knee: Secondary | ICD-10-CM | POA: Diagnosis not present

## 2023-08-16 DIAGNOSIS — M25561 Pain in right knee: Secondary | ICD-10-CM | POA: Diagnosis not present

## 2023-08-16 DIAGNOSIS — R262 Difficulty in walking, not elsewhere classified: Secondary | ICD-10-CM | POA: Diagnosis not present

## 2023-08-23 DIAGNOSIS — M25562 Pain in left knee: Secondary | ICD-10-CM | POA: Diagnosis not present

## 2023-08-23 DIAGNOSIS — M170 Bilateral primary osteoarthritis of knee: Secondary | ICD-10-CM | POA: Diagnosis not present

## 2023-08-23 DIAGNOSIS — M25561 Pain in right knee: Secondary | ICD-10-CM | POA: Diagnosis not present

## 2023-08-23 DIAGNOSIS — R262 Difficulty in walking, not elsewhere classified: Secondary | ICD-10-CM | POA: Diagnosis not present

## 2023-08-28 DIAGNOSIS — I10 Essential (primary) hypertension: Secondary | ICD-10-CM | POA: Diagnosis not present

## 2023-08-28 DIAGNOSIS — Z1211 Encounter for screening for malignant neoplasm of colon: Secondary | ICD-10-CM | POA: Diagnosis not present

## 2023-08-28 DIAGNOSIS — M170 Bilateral primary osteoarthritis of knee: Secondary | ICD-10-CM | POA: Diagnosis not present

## 2023-08-28 DIAGNOSIS — E559 Vitamin D deficiency, unspecified: Secondary | ICD-10-CM | POA: Diagnosis not present

## 2023-08-28 DIAGNOSIS — Z0000 Encounter for general adult medical examination without abnormal findings: Secondary | ICD-10-CM | POA: Diagnosis not present

## 2023-08-28 DIAGNOSIS — Z853 Personal history of malignant neoplasm of breast: Secondary | ICD-10-CM | POA: Diagnosis not present

## 2023-08-28 DIAGNOSIS — N3281 Overactive bladder: Secondary | ICD-10-CM | POA: Diagnosis not present

## 2023-08-28 DIAGNOSIS — E785 Hyperlipidemia, unspecified: Secondary | ICD-10-CM | POA: Diagnosis not present

## 2023-08-28 DIAGNOSIS — M8588 Other specified disorders of bone density and structure, other site: Secondary | ICD-10-CM | POA: Diagnosis not present

## 2023-08-30 DIAGNOSIS — R262 Difficulty in walking, not elsewhere classified: Secondary | ICD-10-CM | POA: Diagnosis not present

## 2023-08-30 DIAGNOSIS — M25562 Pain in left knee: Secondary | ICD-10-CM | POA: Diagnosis not present

## 2023-08-30 DIAGNOSIS — M1711 Unilateral primary osteoarthritis, right knee: Secondary | ICD-10-CM | POA: Diagnosis not present

## 2023-08-30 DIAGNOSIS — M170 Bilateral primary osteoarthritis of knee: Secondary | ICD-10-CM | POA: Diagnosis not present

## 2023-08-30 DIAGNOSIS — M25561 Pain in right knee: Secondary | ICD-10-CM | POA: Diagnosis not present

## 2023-08-30 DIAGNOSIS — M25662 Stiffness of left knee, not elsewhere classified: Secondary | ICD-10-CM | POA: Diagnosis not present

## 2023-08-30 DIAGNOSIS — M1712 Unilateral primary osteoarthritis, left knee: Secondary | ICD-10-CM | POA: Diagnosis not present

## 2023-08-30 DIAGNOSIS — M25661 Stiffness of right knee, not elsewhere classified: Secondary | ICD-10-CM | POA: Diagnosis not present

## 2023-10-07 DIAGNOSIS — M170 Bilateral primary osteoarthritis of knee: Secondary | ICD-10-CM | POA: Diagnosis not present

## 2023-10-07 DIAGNOSIS — E785 Hyperlipidemia, unspecified: Secondary | ICD-10-CM | POA: Diagnosis not present

## 2023-10-07 DIAGNOSIS — I10 Essential (primary) hypertension: Secondary | ICD-10-CM | POA: Diagnosis not present

## 2023-11-06 DIAGNOSIS — E785 Hyperlipidemia, unspecified: Secondary | ICD-10-CM | POA: Diagnosis not present

## 2023-11-06 DIAGNOSIS — I10 Essential (primary) hypertension: Secondary | ICD-10-CM | POA: Diagnosis not present

## 2023-11-06 DIAGNOSIS — M170 Bilateral primary osteoarthritis of knee: Secondary | ICD-10-CM | POA: Diagnosis not present

## 2023-12-06 DIAGNOSIS — Z1211 Encounter for screening for malignant neoplasm of colon: Secondary | ICD-10-CM | POA: Diagnosis not present

## 2023-12-06 DIAGNOSIS — K621 Rectal polyp: Secondary | ICD-10-CM | POA: Diagnosis not present

## 2023-12-06 DIAGNOSIS — K5730 Diverticulosis of large intestine without perforation or abscess without bleeding: Secondary | ICD-10-CM | POA: Diagnosis not present

## 2023-12-07 DIAGNOSIS — I10 Essential (primary) hypertension: Secondary | ICD-10-CM | POA: Diagnosis not present

## 2023-12-07 DIAGNOSIS — M170 Bilateral primary osteoarthritis of knee: Secondary | ICD-10-CM | POA: Diagnosis not present

## 2023-12-07 DIAGNOSIS — E785 Hyperlipidemia, unspecified: Secondary | ICD-10-CM | POA: Diagnosis not present

## 2024-01-07 DIAGNOSIS — E785 Hyperlipidemia, unspecified: Secondary | ICD-10-CM | POA: Diagnosis not present

## 2024-01-07 DIAGNOSIS — I10 Essential (primary) hypertension: Secondary | ICD-10-CM | POA: Diagnosis not present

## 2024-01-07 DIAGNOSIS — M170 Bilateral primary osteoarthritis of knee: Secondary | ICD-10-CM | POA: Diagnosis not present

## 2024-02-06 DIAGNOSIS — E785 Hyperlipidemia, unspecified: Secondary | ICD-10-CM | POA: Diagnosis not present

## 2024-02-06 DIAGNOSIS — I10 Essential (primary) hypertension: Secondary | ICD-10-CM | POA: Diagnosis not present

## 2024-02-06 DIAGNOSIS — M170 Bilateral primary osteoarthritis of knee: Secondary | ICD-10-CM | POA: Diagnosis not present

## 2024-02-29 DIAGNOSIS — E785 Hyperlipidemia, unspecified: Secondary | ICD-10-CM | POA: Diagnosis not present

## 2024-02-29 DIAGNOSIS — E559 Vitamin D deficiency, unspecified: Secondary | ICD-10-CM | POA: Diagnosis not present

## 2024-02-29 DIAGNOSIS — M170 Bilateral primary osteoarthritis of knee: Secondary | ICD-10-CM | POA: Diagnosis not present

## 2024-02-29 DIAGNOSIS — I10 Essential (primary) hypertension: Secondary | ICD-10-CM | POA: Diagnosis not present

## 2024-03-05 DIAGNOSIS — E559 Vitamin D deficiency, unspecified: Secondary | ICD-10-CM | POA: Diagnosis not present

## 2024-03-05 DIAGNOSIS — I10 Essential (primary) hypertension: Secondary | ICD-10-CM | POA: Diagnosis not present

## 2024-03-05 DIAGNOSIS — E785 Hyperlipidemia, unspecified: Secondary | ICD-10-CM | POA: Diagnosis not present

## 2024-05-21 ENCOUNTER — Other Ambulatory Visit: Payer: Self-pay | Admitting: Family Medicine

## 2024-05-21 DIAGNOSIS — Z1231 Encounter for screening mammogram for malignant neoplasm of breast: Secondary | ICD-10-CM

## 2024-06-12 ENCOUNTER — Ambulatory Visit
